# Patient Record
Sex: Male | Born: 2000 | Race: Black or African American | Hispanic: No | Marital: Single | Smoking: Never smoker
Health system: Southern US, Community
[De-identification: ages and names within clinical notes are randomized; demographics above are authoritative.]

## PROBLEM LIST (undated history)

## (undated) DIAGNOSIS — J45909 Unspecified asthma, uncomplicated: Secondary | ICD-10-CM

## (undated) DIAGNOSIS — T7840XA Allergy, unspecified, initial encounter: Secondary | ICD-10-CM

## (undated) HISTORY — DX: Unspecified asthma, uncomplicated: J45.909

## (undated) HISTORY — DX: Allergy, unspecified, initial encounter: T78.40XA

---
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
## 2019-01-18 ENCOUNTER — Ambulatory Visit (INDEPENDENT_AMBULATORY_CARE_PROVIDER_SITE_OTHER): Payer: No Typology Code available for payment source | Admitting: Family Medicine

## 2019-01-18 ENCOUNTER — Encounter: Payer: Self-pay | Admitting: Family Medicine

## 2019-01-18 ENCOUNTER — Other Ambulatory Visit: Payer: Self-pay

## 2019-01-18 DIAGNOSIS — T1490XA Injury, unspecified, initial encounter: Secondary | ICD-10-CM

## 2019-01-18 DIAGNOSIS — J4520 Mild intermittent asthma, uncomplicated: Secondary | ICD-10-CM | POA: Diagnosis not present

## 2019-01-18 DIAGNOSIS — Z6841 Body Mass Index (BMI) 40.0 and over, adult: Secondary | ICD-10-CM | POA: Diagnosis not present
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
## 2019-07-20 ENCOUNTER — Ambulatory Visit: Payer: No Typology Code available for payment source | Admitting: Family Medicine
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
